# Patient Record
Sex: Female | Born: 1970 | Race: White | Hispanic: Yes | Marital: Married | Smoking: Never smoker
Health system: Southern US, Community
[De-identification: ages and names within clinical notes are randomized; demographics above are authoritative.]

## PROBLEM LIST (undated history)

## (undated) DIAGNOSIS — E119 Type 2 diabetes mellitus without complications: Secondary | ICD-10-CM

## (undated) DIAGNOSIS — I10 Essential (primary) hypertension: Secondary | ICD-10-CM

---

## 2014-02-13 ENCOUNTER — Other Ambulatory Visit: Payer: Self-pay | Admitting: Obstetrics and Gynecology

## 2014-02-13 DIAGNOSIS — Z1231 Encounter for screening mammogram for malignant neoplasm of breast: Secondary | ICD-10-CM

## 2014-04-12 ENCOUNTER — Ambulatory Visit (HOSPITAL_COMMUNITY)
Admission: RE | Admit: 2014-04-12 | Discharge: 2014-04-12 | Disposition: A | Discharge status: Home / Self Care | Payer: Self-pay | Source: Ambulatory Visit | Attending: Obstetrics and Gynecology | Admitting: Obstetrics and Gynecology

## 2014-04-12 ENCOUNTER — Encounter (HOSPITAL_COMMUNITY): Payer: Self-pay

## 2014-04-12 VITALS — BP 118/76 | Temp 98.5°F | Ht 62.0 in | Wt 189.0 lb

## 2014-04-12 DIAGNOSIS — Z1239 Encounter for other screening for malignant neoplasm of breast: Secondary | ICD-10-CM

## 2014-04-12 DIAGNOSIS — Z1231 Encounter for screening mammogram for malignant neoplasm of breast: Secondary | ICD-10-CM

## 2014-04-12 HISTORY — DX: Type 2 diabetes mellitus without complications: E11.9

## 2014-04-12 HISTORY — DX: Essential (primary) hypertension: I10

## 2014-04-12 NOTE — Patient Instructions (Signed)
Explained to Elizabeth Pittman that she did not need a Pap smear today due to last Pap smear was in September 2014 per patient. Let her know BCCCP will cover Pap smears every 3 years unless has a history of abnormal Pap smears.  Let patient know the [facility] will follow up with her within the next couple weeks with results by letter or phone. [name] verbalized understanding. Patient escorted to mammography for a screening mammogram.  Louana Fontenot, Kathaleen Maserhristine Poll, RN 9:47 AM

## 2014-04-12 NOTE — Progress Notes (Signed)
No complaints today.  Pap Smear: Pap smear not completed today. [facility] and normal per patient. Per patient has no history of an abnormal Pap smear. No Pap smear results in EPIC.  Physical exam: Breasts Breasts symmetrical. No skin abnormalities bilateral breasts. No nipple retraction bilateral breasts. No nipple discharge bilateral breasts. No lymphadenopathy. No lumps palpated bilateral breasts. No complaints of pain or tenderness on exam. Patient escorted to mammography for a screening mammogram.        Pelvic/Bimanual No Pap smear completed today since last Pap smear was in September 2014 per patient. Pap smear not indicated per BCCCP guidelines.

## 2015-11-20 ENCOUNTER — Other Ambulatory Visit (HOSPITAL_COMMUNITY): Payer: Self-pay | Admitting: *Deleted

## 2015-11-20 DIAGNOSIS — N644 Mastodynia: Secondary | ICD-10-CM

## 2015-11-29 ENCOUNTER — Encounter (HOSPITAL_COMMUNITY): Payer: Self-pay

## 2015-11-29 ENCOUNTER — Ambulatory Visit (HOSPITAL_COMMUNITY)
Admission: RE | Admit: 2015-11-29 | Discharge: 2015-11-29 | Disposition: A | Discharge status: Home / Self Care | Payer: Self-pay | Source: Ambulatory Visit | Attending: Obstetrics and Gynecology | Admitting: Obstetrics and Gynecology

## 2015-11-29 ENCOUNTER — Ambulatory Visit
Admission: RE | Admit: 2015-11-29 | Discharge: 2015-11-29 | Disposition: A | Discharge status: Home / Self Care | Payer: No Typology Code available for payment source | Source: Ambulatory Visit | Attending: Obstetrics and Gynecology | Admitting: Obstetrics and Gynecology

## 2015-11-29 ENCOUNTER — Other Ambulatory Visit (HOSPITAL_COMMUNITY): Payer: Self-pay | Admitting: Obstetrics and Gynecology

## 2015-11-29 VITALS — BP 140/84 | Temp 98.5°F | Ht 62.0 in | Wt 205.6 lb

## 2015-11-29 DIAGNOSIS — N644 Mastodynia: Secondary | ICD-10-CM

## 2015-11-29 DIAGNOSIS — Z1239 Encounter for other screening for malignant neoplasm of breast: Secondary | ICD-10-CM

## 2015-11-29 NOTE — Patient Instructions (Signed)
Explained breast self awareness to Elizabeth Pittman. Patient did not need a Pap smear today due to last Pap smear was in January 2017 per patient. Informed patient her next Pap smear will be due in January 2018 due to her recent history of an abnormal Pap smear. [facility] mamm[facility]. Appointment scheduled for Thursday, November 29, 2015 at 0900. Let patient know the Breast Center will follow up with her within the next couple weeks with results by letter or phone. [name] verbalized understanding.  Chizara Mena, Kathaleen Maserhristine Poll, RN 10:04 AM

## 2015-11-29 NOTE — Progress Notes (Signed)
Patient complained of left outer breast pain that lasted two weeks and has resolved.  Pap Smear: Pap smear not completed today. Last Pap smear was in January 2017 at [facility] and normal. Per patient has a history of an abnormal Pap smear around October 2016 that a [facility] was completed for follow up. No Pap smear results are in EPIC.  Physical exam: Breasts Breasts symmetrical. No skin abnormalities bilateral breasts. No nipple retraction bilateral breasts. No nipple discharge bilateral breasts. No lymphadenopathy. No lumps palpated bilateral breasts. No complaints of pain or tenderness on exam. [facility] mamm[facility]. Appointment scheduled for Thursday, November 29, 2015 at 0900.        Pelvic/Bimanual No Pap smear completed today since last Pap smear was in January 2017 per patient. Pap smear not indicated per BCCCP guidelines.   Smoking History: Patient has never smoked.  Patient Navigation: Patient education provided. [facility]. Spanish interpreter provided.  Used Spanish interpreter [employer] Lindner from [employer].

## 2015-11-30 ENCOUNTER — Encounter (HOSPITAL_COMMUNITY): Payer: Self-pay | Admitting: *Deleted

## 2015-12-04 ENCOUNTER — Other Ambulatory Visit: Payer: Self-pay | Admitting: Obstetrics and Gynecology

## 2015-12-04 DIAGNOSIS — R928 Other abnormal and inconclusive findings on diagnostic imaging of breast: Secondary | ICD-10-CM

## 2015-12-17 ENCOUNTER — Ambulatory Visit
Admission: RE | Admit: 2015-12-17 | Discharge: 2015-12-17 | Disposition: A | Discharge status: Home / Self Care | Payer: No Typology Code available for payment source | Source: Ambulatory Visit | Attending: Obstetrics and Gynecology | Admitting: Obstetrics and Gynecology

## 2015-12-17 DIAGNOSIS — R928 Other abnormal and inconclusive findings on diagnostic imaging of breast: Secondary | ICD-10-CM

## 2016-01-10 ENCOUNTER — Encounter: Payer: No Typology Code available for payment source | Admitting: Family Medicine

## 2016-04-21 ENCOUNTER — Telehealth (HOSPITAL_COMMUNITY): Payer: Self-pay | Admitting: *Deleted

## 2016-04-21 NOTE — Telephone Encounter (Signed)
Telephoned patient at home number and patient states [facility] [address] in Dec 2017. Patient states pap smear was normal. Used interpreter Delorise RoyalsJulie Sowell.

## 2016-12-01 ENCOUNTER — Other Ambulatory Visit: Payer: Self-pay | Admitting: Obstetrics and Gynecology

## 2016-12-01 DIAGNOSIS — Z1231 Encounter for screening mammogram for malignant neoplasm of breast: Secondary | ICD-10-CM

## 2017-01-27 ENCOUNTER — Other Ambulatory Visit: Payer: Self-pay | Admitting: Obstetrics and Gynecology

## 2017-01-27 DIAGNOSIS — Z1231 Encounter for screening mammogram for malignant neoplasm of breast: Secondary | ICD-10-CM

## 2017-01-29 ENCOUNTER — Encounter (HOSPITAL_COMMUNITY): Payer: Self-pay

## 2017-01-29 ENCOUNTER — Ambulatory Visit
Admission: RE | Admit: 2017-01-29 | Discharge: 2017-01-29 | Disposition: A | Discharge status: Home / Self Care | Payer: Self-pay | Source: Ambulatory Visit | Attending: Obstetrics and Gynecology | Admitting: Obstetrics and Gynecology

## 2017-01-29 ENCOUNTER — Ambulatory Visit (HOSPITAL_COMMUNITY)
Admission: RE | Admit: 2017-01-29 | Discharge: 2017-01-29 | Disposition: A | Discharge status: Home / Self Care | Payer: Self-pay | Source: Ambulatory Visit | Attending: Obstetrics and Gynecology | Admitting: Obstetrics and Gynecology

## 2017-01-29 VITALS — BP 140/86 | Temp 97.8°F | Ht 61.5 in | Wt 221.2 lb

## 2017-01-29 DIAGNOSIS — Z1231 Encounter for screening mammogram for malignant neoplasm of breast: Secondary | ICD-10-CM

## 2017-01-29 DIAGNOSIS — Z01419 Encounter for gynecological examination (general) (routine) without abnormal findings: Secondary | ICD-10-CM

## 2017-01-29 NOTE — Patient Instructions (Addendum)
Explained breast self awareness to Elizabeth Pittman. Let patient know that her next Pap smear will be due based on the results of today's Pap smear. [facility] mamm[facility]. Appointment scheduled for Thursday, January 29, 2017 at 1440. Let patient know the Breast Center will follow up with her within the next couple weeks with results by letter or phone. Elizabeth Pittman verbalized understanding.  Danija Gosa, Kathaleen Maserhristine Poll, RN 2:23 PM

## 2017-01-29 NOTE — Progress Notes (Signed)
No complaints today.   Pap Smear: Pap smear completed today. Last Pap smear was in January 2017 at [facility] and normal. Per patient has a history of an abnormal Pap smear around October 2016 that a [facility] was completed for follow up. No Pap smear results are in EPIC  Physical exam: Breasts Breasts symmetrical. No skin abnormalities bilateral breasts. No nipple retraction bilateral breasts. No nipple discharge bilateral breasts. No lymphadenopathy. No lumps palpated bilateral breasts. No complaints of pain or tenderness on exam. [facility] mamm[facility]. Appointment scheduled for Thursday, January 29, 2017 at 1440.       Pelvic/Bimanual   Ext Genitalia No lesions, no swelling and no discharge observed on external genitalia.         Vagina Vagina pink and normal texture. No lesions or discharge observed in vagina.          Cervix Cervix is present. Cervix pink and of normal texture. No discharge observed.     Uterus Uterus is present and palpable. Uterus in normal position and normal size.        Adnexae Bilateral ovaries present and palpable. No tenderness on palpation.          Rectovaginal No rectal exam completed today since patient had no rectal complaints. No skin abnormalities observed on exam.    Smoking History: Patient has never smoked.  Patient Navigation: Patient education provided. [facility] services provided [facility]. Spanish interpreter provided.  Used Spanish interpreter [employer] Herrera from [employer].No complaints today.

## 2017-01-30 ENCOUNTER — Encounter (HOSPITAL_COMMUNITY): Payer: Self-pay | Admitting: *Deleted

## 2017-01-30 LAB — CYTOLOGY - PAP
Diagnosis: NEGATIVE
HPV: NOT DETECTED

## 2017-02-16 ENCOUNTER — Encounter (HOSPITAL_COMMUNITY): Payer: Self-pay | Admitting: *Deleted

## 2017-02-16 NOTE — Progress Notes (Signed)
Letter mailed to patient with negative pap smear results and HPV was negative.

## 2018-02-18 IMAGING — MG DIGITAL SCREENING BILATERAL MAMMOGRAM WITH CAD
4 series · 4 of 4 positions shown · non-contrast
Comparison: Previous exam(s).

CLINICAL DATA: Screening.

EXAM:
DIGITAL SCREENING BILATERAL MAMMOGRAM WITH CAD

[R MLO]
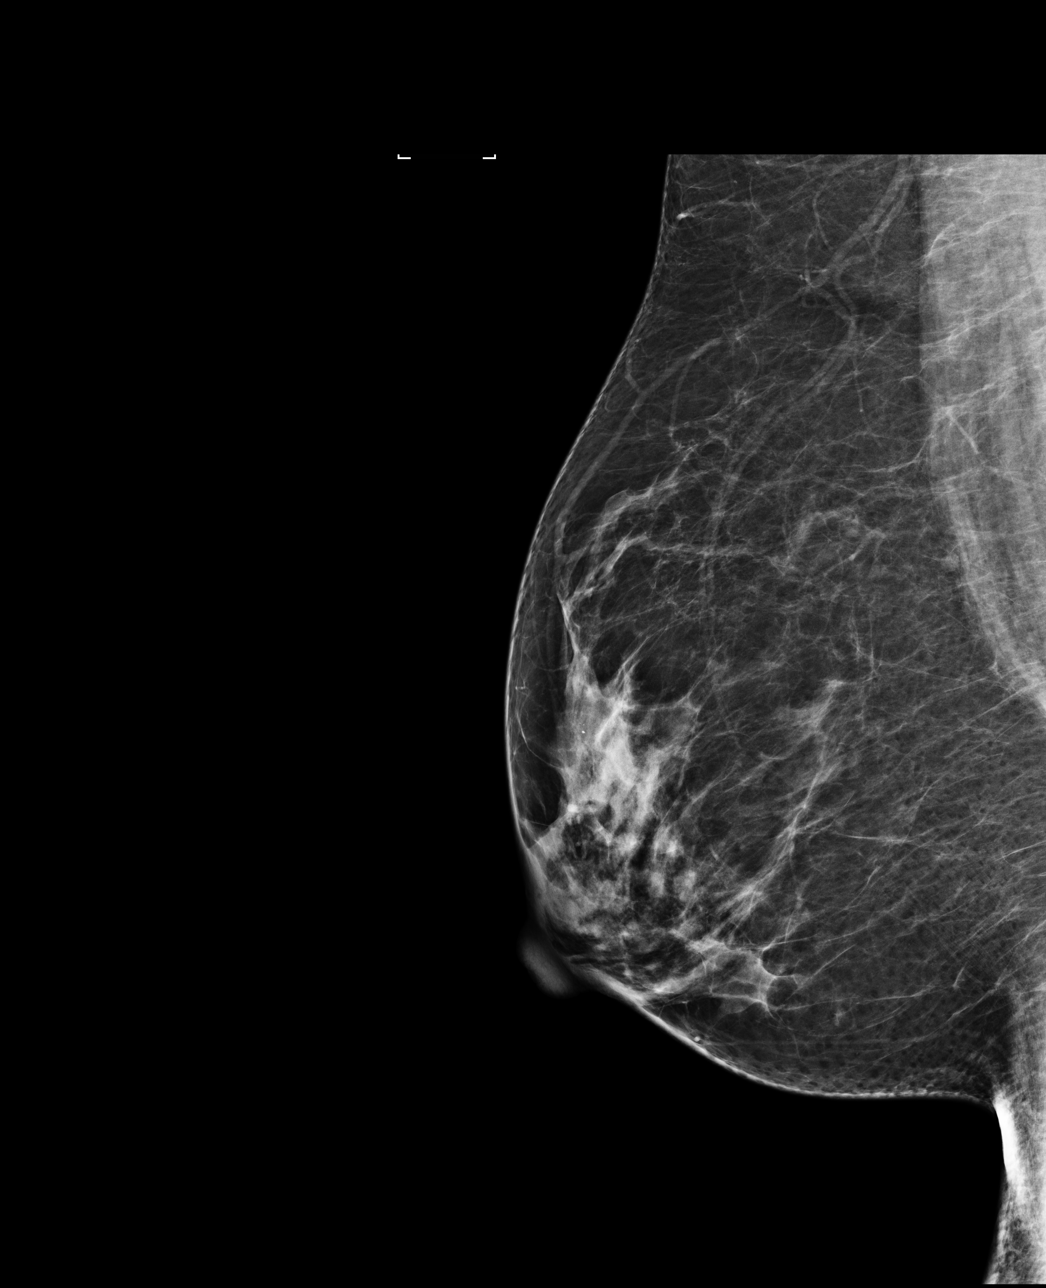

[L CC]
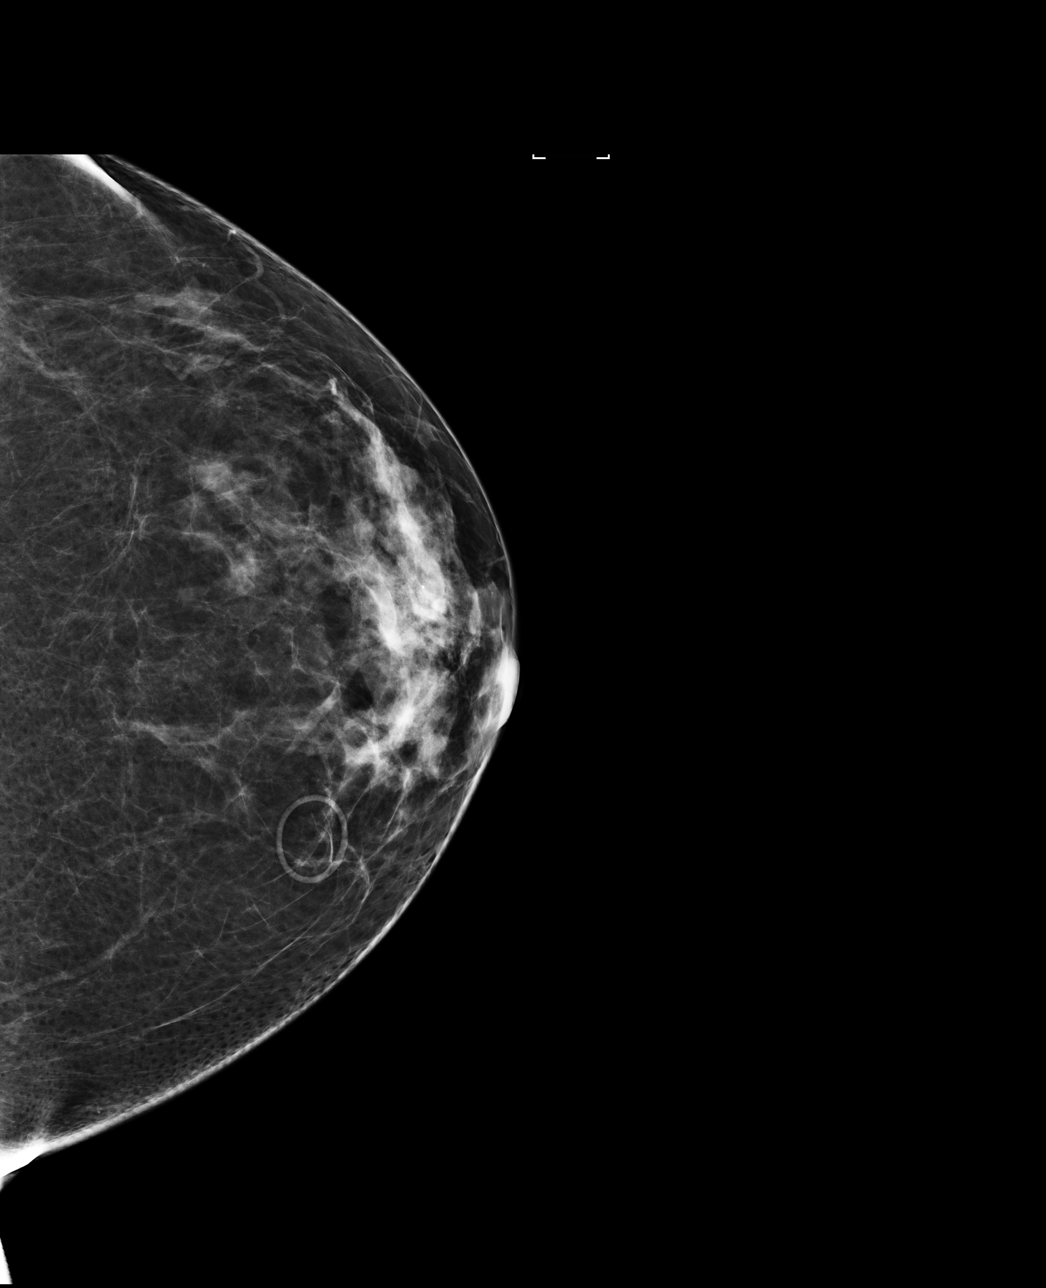

[R CC]
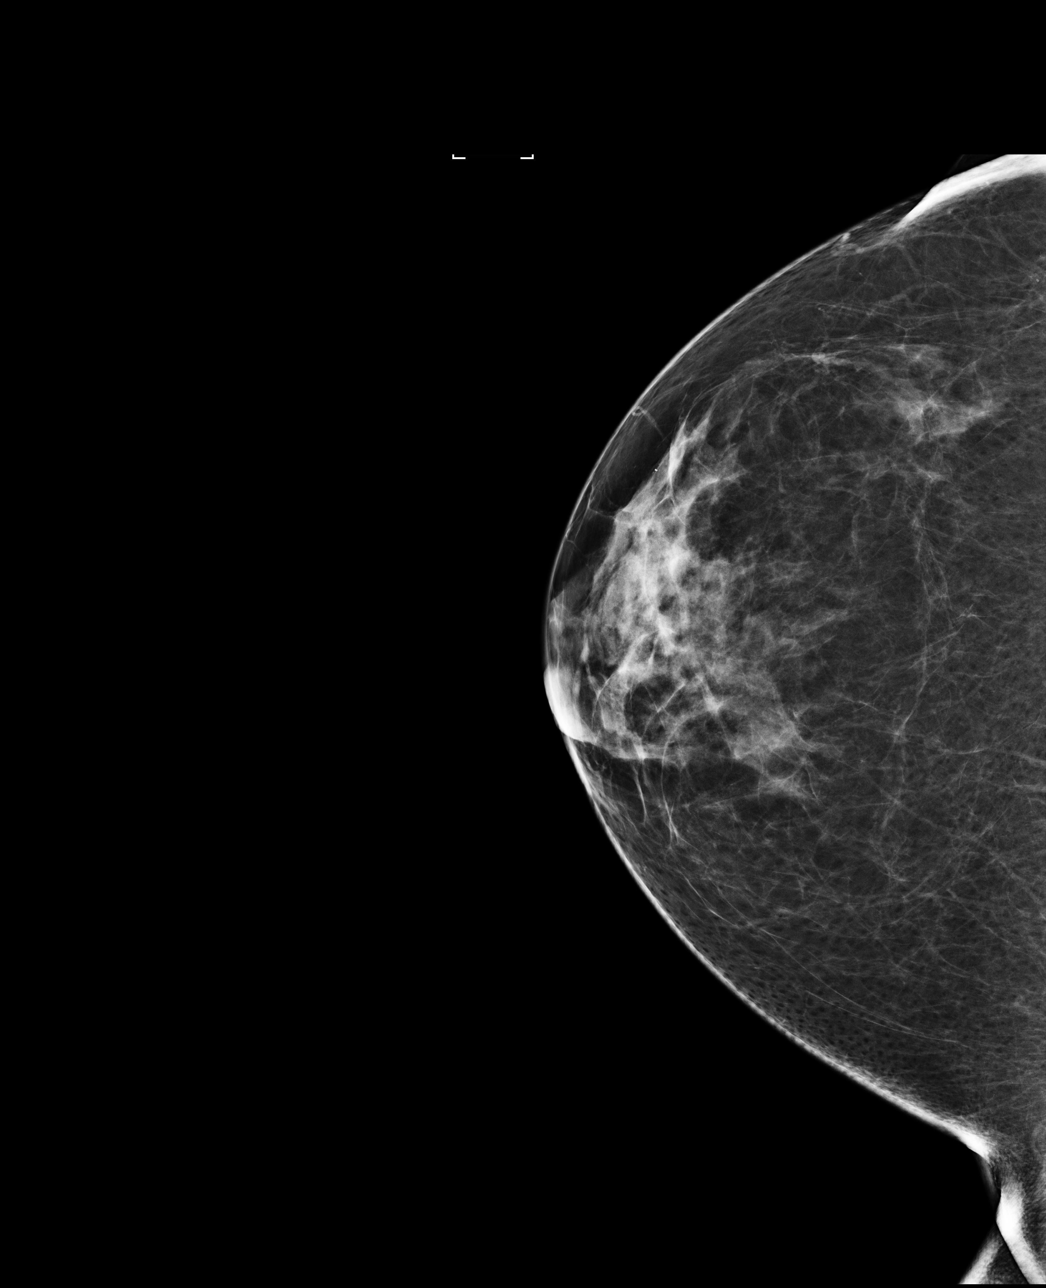

[L MLO]
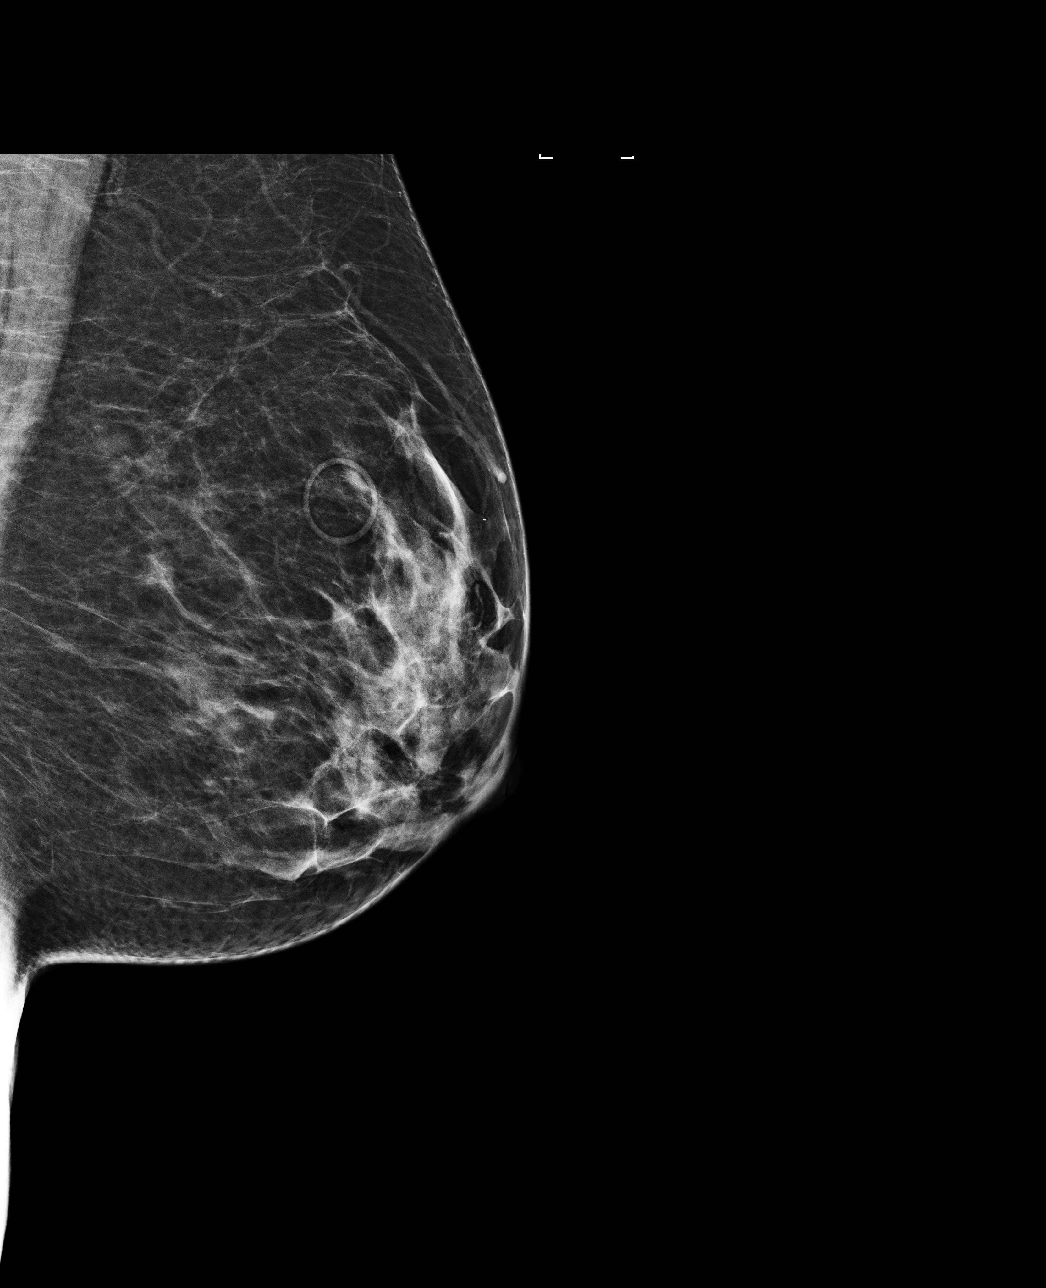

[4 of 4 positions shown; findings below may reference images not displayed]

ACR Breast Density Category b: There are scattered areas of
fibroglandular density.
FINDINGS: In the left breast, a possible asymmetry warrants further
evaluation. In the right breast, no findings suspicious for
malignancy. Images were processed with CAD.
IMPRESSION: Further evaluation is suggested for possible asymmetry in the left
breast.

RECOMMENDATION:
Diagnostic mammogram and possibly ultrasound of the left breast.
(Code:[id number]-[id number])

The patient will be contacted regarding the findings, and additional
imaging will be scheduled.

BI-RADS CATEGORY  0: Incomplete. Need additional imaging evaluation
and/or prior mammograms for comparison.
